# Patient Record
Sex: Female | Born: 1990
Health system: Southern US, Community
[De-identification: ages and names within clinical notes are randomized; demographics above are authoritative.]

## PROBLEM LIST (undated history)

## (undated) DIAGNOSIS — J4520 Mild intermittent asthma, uncomplicated: Secondary | ICD-10-CM

## (undated) DIAGNOSIS — T7840XA Allergy, unspecified, initial encounter: Secondary | ICD-10-CM

## (undated) HISTORY — DX: Allergy, unspecified, initial encounter: T78.40XA

## (undated) HISTORY — PX: NO PAST SURGERIES: SHX2092

## (undated) HISTORY — DX: Mild intermittent asthma, uncomplicated: J45.20

---
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
## 2015-11-27 ENCOUNTER — Other Ambulatory Visit: Payer: Self-pay | Admitting: Family Medicine

## 2015-11-27 ENCOUNTER — Ambulatory Visit (HOSPITAL_BASED_OUTPATIENT_CLINIC_OR_DEPARTMENT_OTHER)
Admission: RE | Admit: 2015-11-27 | Discharge: 2015-11-27 | Disposition: A | Discharge status: Home / Self Care | Payer: 59 | Source: Ambulatory Visit | Attending: Family Medicine | Admitting: Family Medicine

## 2015-11-27 DIAGNOSIS — J45990 Exercise induced bronchospasm: Secondary | ICD-10-CM

## 2015-11-27 DIAGNOSIS — J4520 Mild intermittent asthma, uncomplicated: Secondary | ICD-10-CM

## 2015-11-27 DIAGNOSIS — R1310 Dysphagia, unspecified: Secondary | ICD-10-CM

## 2015-11-27 DIAGNOSIS — Z789 Other specified health status: Secondary | ICD-10-CM

## 2015-11-27 MED ORDER — IOHEXOL 350 MG/ML SOLN
100.0000 mL | Freq: Once | INTRAVENOUS | Status: AC | PRN
  Administered 2015-11-27: 100 mL via INTRAVENOUS

## 2016-05-19 DIAGNOSIS — J069 Acute upper respiratory infection, unspecified: Secondary | ICD-10-CM | POA: Diagnosis not present

## 2016-05-24 DIAGNOSIS — J020 Streptococcal pharyngitis: Secondary | ICD-10-CM | POA: Diagnosis not present

## 2016-09-20 DIAGNOSIS — Z30433 Encounter for removal and reinsertion of intrauterine contraceptive device: Secondary | ICD-10-CM | POA: Diagnosis not present

## 2016-11-02 DIAGNOSIS — Z6829 Body mass index (BMI) 29.0-29.9, adult: Secondary | ICD-10-CM | POA: Diagnosis not present

## 2016-11-02 DIAGNOSIS — Z01419 Encounter for gynecological examination (general) (routine) without abnormal findings: Secondary | ICD-10-CM | POA: Diagnosis not present

## 2016-11-02 DIAGNOSIS — R87612 Low grade squamous intraepithelial lesion on cytologic smear of cervix (LGSIL): Secondary | ICD-10-CM | POA: Diagnosis not present

## 2016-11-23 DIAGNOSIS — R87612 Low grade squamous intraepithelial lesion on cytologic smear of cervix (LGSIL): Secondary | ICD-10-CM | POA: Diagnosis not present

## 2016-11-23 DIAGNOSIS — N72 Inflammatory disease of cervix uteri: Secondary | ICD-10-CM | POA: Diagnosis not present

## 2016-12-13 DIAGNOSIS — R911 Solitary pulmonary nodule: Secondary | ICD-10-CM | POA: Insufficient documentation

## 2016-12-28 MED FILL — AMOXICILLIN 875 MG TABLET: 875 | 5 days supply | Qty: 10 | Fill #0

## 2017-01-14 IMAGING — CT CT ANGIO CHEST
1 of 2 series · 20 of 32 positions shown · IV contrast (APPLIED)
Comparison: None.

CLINICAL DATA: Shortness of breath.

EXAM:
CT ANGIOGRAPHY CHEST WITH CONTRAST
TECHNIQUE: Multidetector CT imaging of the chest was performed using the
standard protocol during bolus administration of intravenous
contrast. Multiplanar CT image reconstructions and MIPs were
obtained to evaluate the vascular anatomy.
CONTRAST:  100mL OMNIPAQUE IOHEXOL 350 MG/ML SOLN

[Series 6: pe 1.0 b26f · axial · 0.63mm/px · z∈[-335,-81]mm · 20 of 284 slices shown]
[im 15/284  lung]
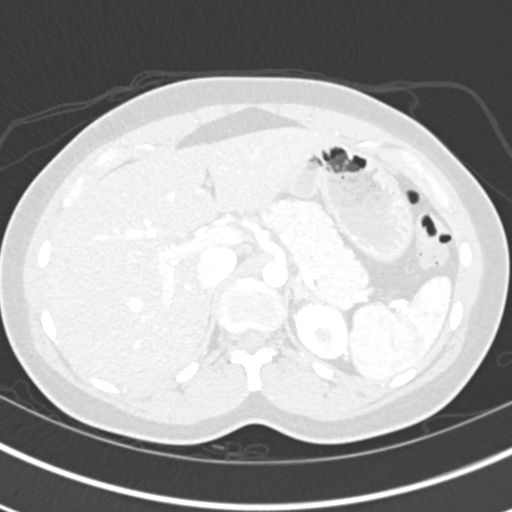
[im 29/284  mediastinal]
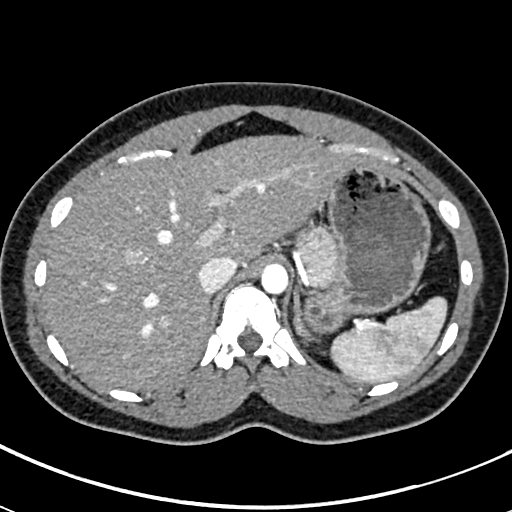
[im 43/284  lung]
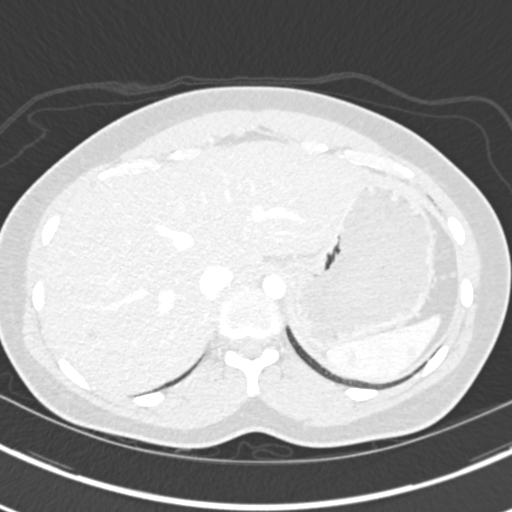
[im 57/284  mediastinal]
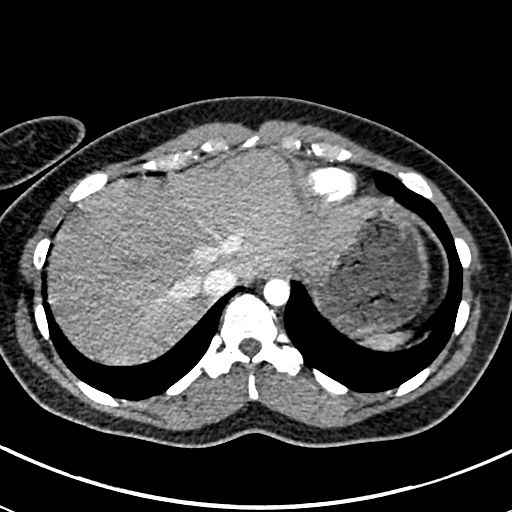
[im 71/284  lung]
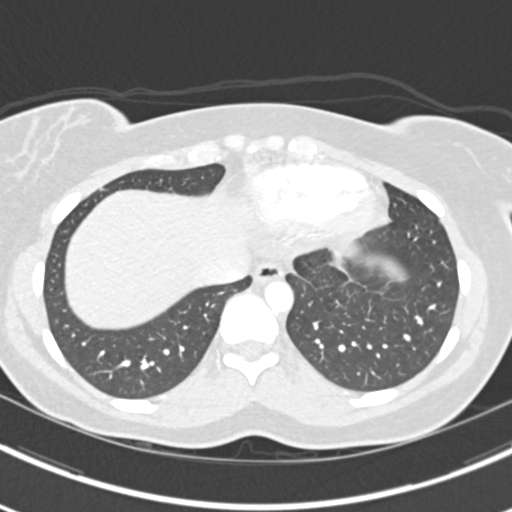
[im 95/284  mediastinal]
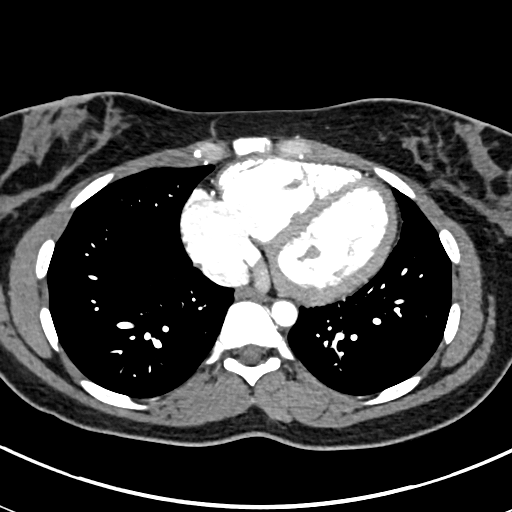
[im 100/284  lung]
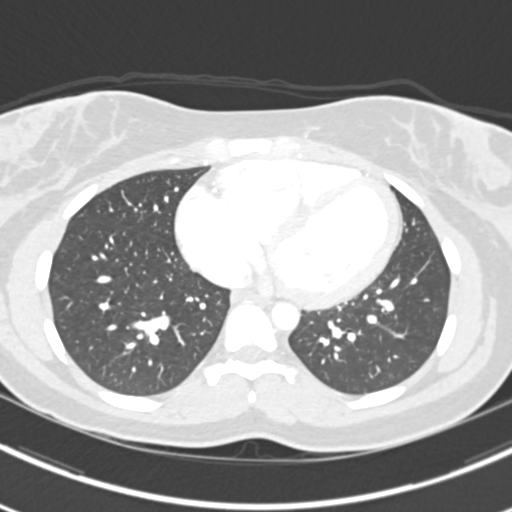
[im 114/284  mediastinal]
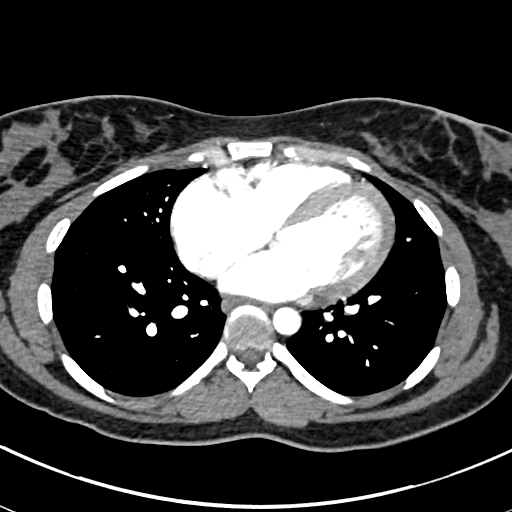
[im 128/284  lung]
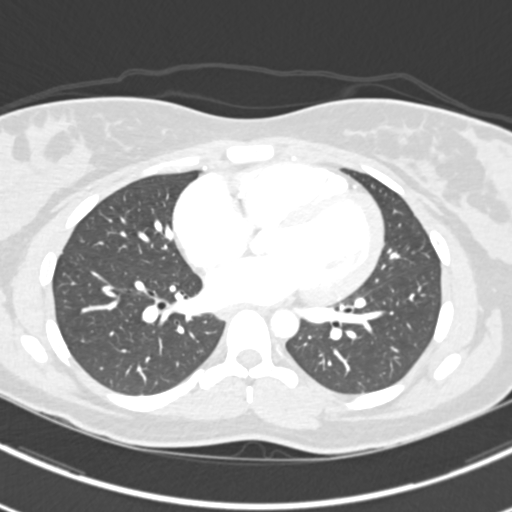
[im 133/284  mediastinal]
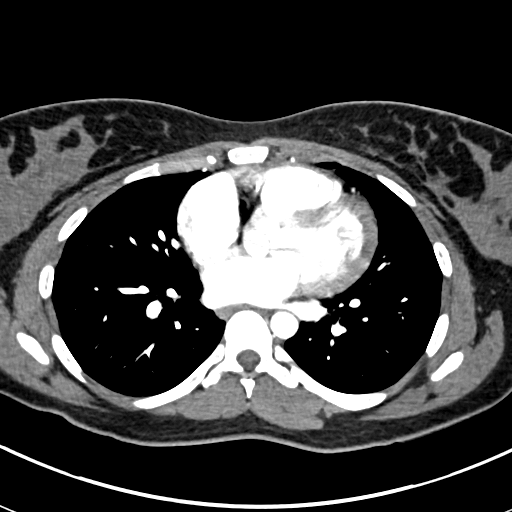
[im 142/284  lung]
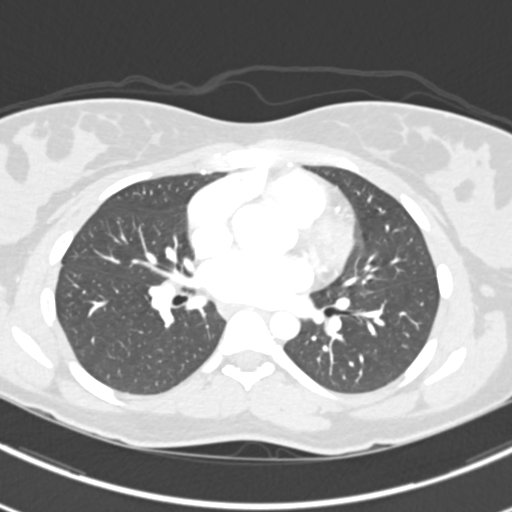
[im 156/284  mediastinal]
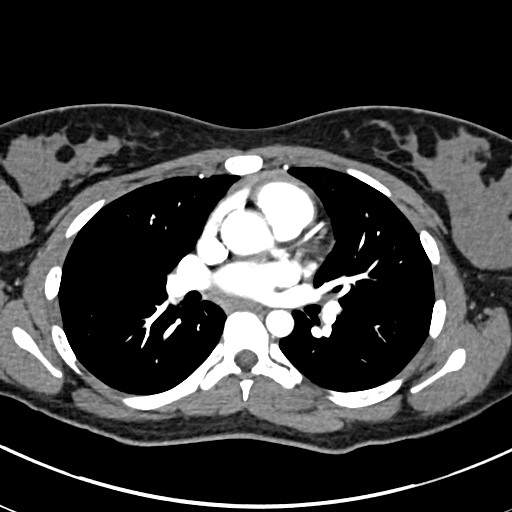
[im 170/284  lung]
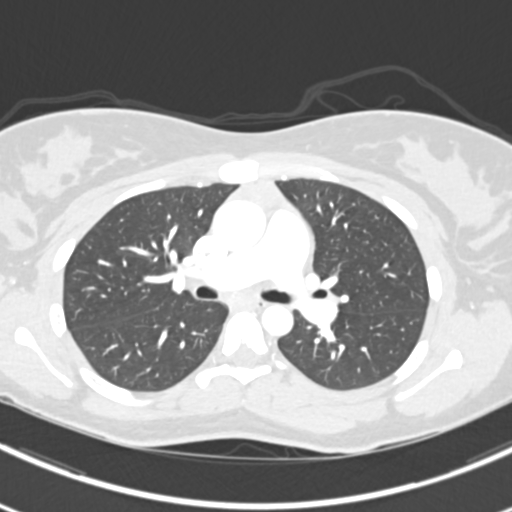
[im 184/284  mediastinal]
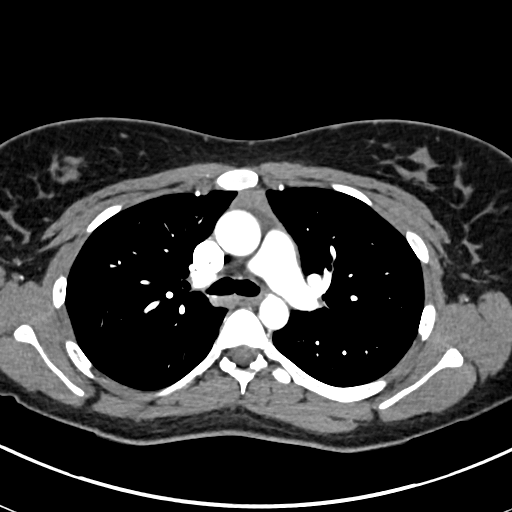
[im 189/284  lung]
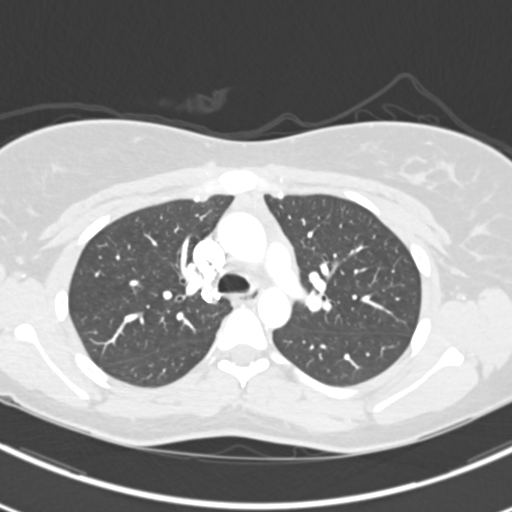
[im 213/284  mediastinal]
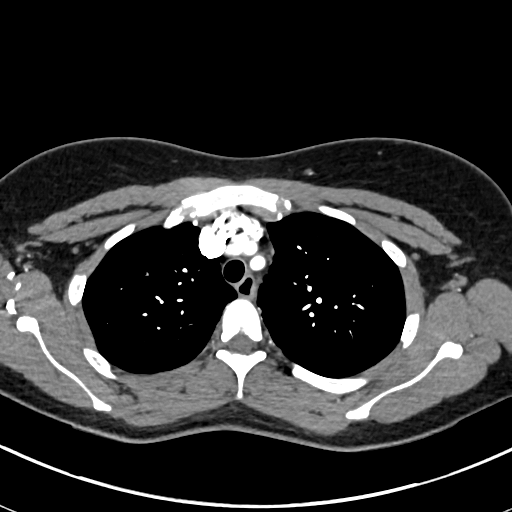
[im 227/284  lung]
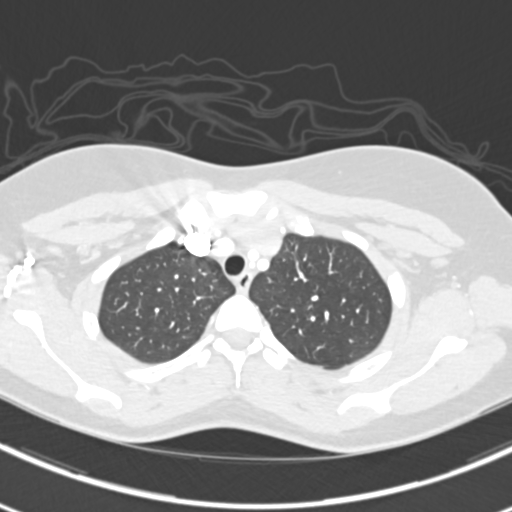
[im 241/284  mediastinal]
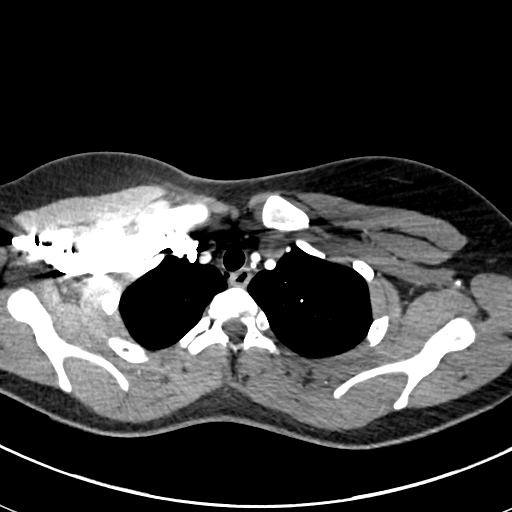
[im 255/284  lung]
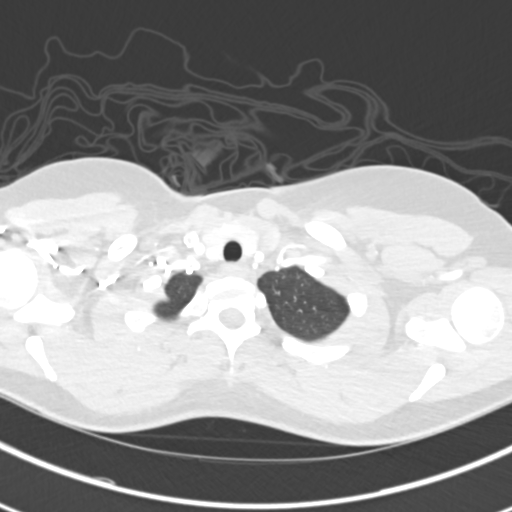
[im 269/284  mediastinal]
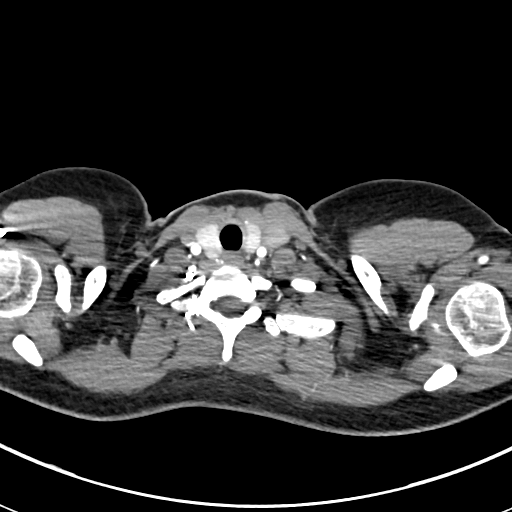

[20 of 32 positions shown; findings below may reference images not displayed]

FINDINGS: No pneumothorax or pleural effusion is noted. No acute pulmonary
disease is noted. There is no evidence of thoracic aortic dissection
or aneurysm. There is no evidence of pulmonary embolus. Visualized
portion of upper abdomen is unremarkable. No mediastinal mass or
adenopathy is noted. No significant osseous abnormality is noted.

Review of the MIP images confirms the above findings.
IMPRESSION: No evidence of pulmonary embolus. No acute cardiopulmonary
abnormality seen.

## 2017-06-13 ENCOUNTER — Encounter: Payer: Self-pay | Admitting: Family Medicine

## 2017-06-13 ENCOUNTER — Ambulatory Visit (INDEPENDENT_AMBULATORY_CARE_PROVIDER_SITE_OTHER): Payer: 59 | Admitting: Family Medicine

## 2017-06-13 VITALS — BP 119/76 | HR 66 | Temp 98.5°F | Resp 20 | Ht 64.25 in | Wt 161.5 lb

## 2017-06-13 DIAGNOSIS — Z6827 Body mass index (BMI) 27.0-27.9, adult: Secondary | ICD-10-CM

## 2017-06-13 DIAGNOSIS — M76891 Other specified enthesopathies of right lower limb, excluding foot: Secondary | ICD-10-CM | POA: Diagnosis not present

## 2017-06-13 DIAGNOSIS — G5701 Lesion of sciatic nerve, right lower limb: Secondary | ICD-10-CM

## 2017-06-13 DIAGNOSIS — Z114 Encounter for screening for human immunodeficiency virus [HIV]: Secondary | ICD-10-CM

## 2017-06-13 DIAGNOSIS — Z7689 Persons encountering health services in other specified circumstances: Secondary | ICD-10-CM | POA: Diagnosis not present

## 2017-06-13 DIAGNOSIS — M7631 Iliotibial band syndrome, right leg: Secondary | ICD-10-CM | POA: Diagnosis not present

## 2017-06-13 DIAGNOSIS — Z975 Presence of (intrauterine) contraceptive device: Secondary | ICD-10-CM

## 2017-06-13 NOTE — Progress Notes (Signed)
Patient ID: Laurie Bright, female  DOB: 1991/03/30, 26 y.o.   MRN: [id number] Patient Care Team    Relationship Specialty Notifications Start End  Kiylee Leatherwood, DO PCP - General Family Medicine  06/13/17   Christene Lye, MD Referring Physician Otolaryngology  06/13/17   Maryan Rued, NP Nurse Practitioner Gynecology  06/14/17     Chief Complaint  Patient presents with  . Establish Care  . Annual Exam    Subjective:  Laurie Bright is a 26 y.o.  female present for new patient establishment. All past medical history, surgical history, allergies, family history, immunizations, medications and social history were obtained and updated in the electronic medical record today. All recent labs, ED visits and hospitalizations within the last year were reviewed.  IT band/piriformis discomfort: Patient presents for new patient establishment with complaints of right hip flexor pain that has been recurring since March 2018. She is a Pharmacist, hospital, and a runner. She reports she started warming up and cooling down religiously. She is very conscientious of her running shoe mileage. She has been performing stretches, and states it feels better and in external rotation of her hip. She typically runs on a low impact treadmill. She is getting more concerned since this seems to be lingering despite icing and stretching. She also endorses tenderness right lateral hip and right piriformis.  Health maintenance:  Colonoscopy: No family history, screen at 44 Mammogram: No family history screening at 35 Cervical cancer screening: last pap: 10/2016, results: Normal per patient, completed by: [facility] Immunizations: tdap 2015 UTD, Influenza 2016 (encouraged yearly) Infectious disease screening: HIV completed today per patient request.   Depression screen [facility] 2/9 06/13/2017  Decreased Interest 0  Down, Depressed, Hopeless 0  PHQ - 2 Score 0   No flowsheet data found.    Current Exercise  Habits: Home exercise routine;Structured exercise class, Type of exercise: strength training/weights;Other - see comments, Time (Minutes): 60, Frequency (Times/Week): 6, Weekly Exercise (Minutes/Week): 360, Intensity: Intense Exercise limited by: None identified Fall Risk  06/13/2017  Falls in the past year? No     Immunization History  Administered Date(s) Administered  . Influenza, Seasonal, Injecte, Preservative Fre 12/04/2014, 11/27/2015  . Meningococcal Conjugate 07/17/2014    No exam data present  Past Medical History:  Diagnosis Date  . Allergy   . Asthma, intermittent    Not on File Past Surgical History:  Procedure Laterality Date  . NO PAST SURGERIES     Family History  Problem Relation Age of Onset  . Diabetes Maternal Grandmother   . Diabetes Paternal Grandmother    Social History   Social History  . Marital status: Single    Spouse name: N/A  . Number of children: 0  . Years of education: PhD   Occupational History  . Not on file.   Social History Main Topics  . Smoking status: Never Smoker  . Smokeless tobacco: Never Used  . Alcohol use No  . Drug use: No  . Sexual activity: Yes    Partners: Male    Birth control/ protection: IUD   Other Topics Concern  . 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   Drinks caffeinated beverages, uses herbal remedies. Takes a daily vitamin.   Wears her seatbelt. Wears a bicycle helmet.   Exercise is very routinely both with her current employment and she is also a runner.   Smoke detector in the  home. Firearms locked in the home.   Feels safe in her relationships.      Allergies as of 06/13/2017   Not on File     Medication List       Accurate as of 06/13/17 11:59 PM. Always use your most recent med list.          KYLEENA 19.5 MG Iud Generic drug:  Levonorgestrel by Intrauterine route.   meloxicam 15 MG tablet Commonly known as:  MOBIC Take 1 tablet (15 mg total)  by mouth daily.       All past medical history, surgical history, allergies, family history, immunizations andmedications were updated in the EMR today and reviewed under the history and medication portions of their EMR.    Recent Results (from the past 2160 hour(s))  HIV antibody     Status: None   Collection Time: 06/13/17  2:28 PM  Result Value Ref Range   HIV 1&2 Ab, 4th Generation NONREACTIVE NONREACTIVE    Comment:   HIV-1 antigen and HIV-1/HIV-2 antibodies were not detected.  There is no laboratory evidence of HIV infection.   HIV-1/2 Antibody Diff        Not indicated. HIV-1 RNA, Qual TMA          Not indicated.     PLEASE NOTE: This information has been disclosed to you from records whose confidentiality may be protected by state law. If your state requires such protection, then the state law prohibits you from making any further disclosure of the information without the specific written consent of the person to whom it pertains, or as otherwise permitted by law. A general authorization for the release of medical or other information is NOT sufficient for this purpose.   The performance of this assay has not been clinically validated in patients less than 47 years old.   For additional information please refer to http://education.questdiagnostics.com/faq/FAQ106.  (This link is being provided for informational/educational purposes only.)       Ct Angio Chest Pe W/cm &/or Wo Cm  Result Date: 11/27/2015 CLINICAL DATA:  Shortness of breath. EXAM: CT ANGIOGRAPHY CHEST WITH CONTRAST TECHNIQUE: Multidetector CT imaging of the chest was performed using the standard protocol during bolus administration of intravenous contrast. Multiplanar CT image reconstructions and MIPs were obtained to evaluate the vascular anatomy. CONTRAST:  OMNIPAQUE IOHEXOL 350 MG/ML SOLN COMPARISON:  None. FINDINGS: No pneumothorax or pleural effusion is noted. No acute pulmonary disease is noted.  There is no evidence of thoracic aortic dissection or aneurysm. There is no evidence of pulmonary embolus. Visualized portion of upper abdomen is unremarkable. No mediastinal mass or adenopathy is noted. No significant osseous abnormality is noted. Review of the MIP images confirms the above findings. IMPRESSION: No evidence of pulmonary embolus. No acute cardiopulmonary abnormality seen. Electronically Signed   By: Lupita Raider, M.D.   On: 11/27/2015 19:17     ROS: 14 pt review of systems performed and negative (unless mentioned in an HPI)  Objective: BP 119/76 (BP Location: Right Arm, Patient Position: Sitting, Cuff Size: Normal)   Pulse 66   Temp 98.5 F (36.9 C)   Resp 20   Ht 5' 4.25" (1.632 m)   Wt 161 lb 8 oz (73.3 kg)   SpO2 98%   BMI 27.51 kg/m  Gen: Afebrile. No acute distress. Nontoxic in appearance, well-developed, well-nourished,  Very pleasant, Caucasian female. Physically fit. HENT: AT. [place].  MMM Eyes:Pupils Equal Round Reactive to light, Extraocular movements intact,  Conjunctiva without redness, discharge or icterus. Neck/lymp/endocrine: Supple, no thyromegaly CV: RRR no murmur, no edema, +2/4 P posterior tibialis pulses.  Chest: CTAB, no wheeze, rhonchi or crackles.  Abd: Soft.NTND. BS present  Skin: Warm and well-perfused. Skin intact. Neuro/Msk: Normal gait. PERLA. EOMi. Alert. Oriented x3.  No erythema, no soft tissue swelling. Mild tenderness to palpation lateral right thigh/IT band.Tenderness to palpation right piriformis. No tenderness to palpation over hip flexor. Negative Faber and straight leg raise bilaterally.  Muscle strength 5/5 lower extremity without discomfort in resistance. Psych: Normal affect, dress and demeanor. Normal speech. Normal thought content and judgment.   Assessment/plan: Burtis Junesatalia Earhart is a 26 y.o. female present for establishment.  Iliotibial band syndrome of right side Piriformis syndrome, right Hip flexor tendinitis, right -  Discussed resting, stretching, NSAIDs. OMT. Resting is all by easy for her considering her job entails her being physically active. -  Meloxicam daily with a meal for 2 weeks, then can use at times of injury. - Ambulatory referral to Sports Medicine--> OMT referral.  - f/u PRN  Encounter for screening for HIV - pt desired testing today - HIV antibody  IUD (intrauterine device) in place - Placed November 2017 by Charlotte SanesGYN, Kyleena (5 year)  BMI 27.0-27.9,adult - physically fit.    Return if symptoms worsen or fail to improve.   Note is dictated utilizing voice recognition software. Although note has been proof read prior to signing, occasional typographical errors still can be missed. If any questions arise, please do not hesitate to call for verification.  Electronically signed by: Felix Pacinienee Leba Tibbitts, DO [facility]

## 2017-06-13 NOTE — Patient Instructions (Signed)
It was a pleasure to meet you today. I will refer you to Dr. Katrinka BlazingSmith for OMT (osteopathic manipulation) .   I have called in mobic for you to use daily for next 2 weeks (with meal). You can then use during times of injury.   We will call you with lab results once available.     Piriformis Syndrome Rehab Ask your health care provider which exercises are safe for you. Do exercises exactly as told by your health care provider and adjust them as directed. It is normal to feel mild stretching, pulling, tightness, or discomfort as you do these exercises, but you should stop right away if you feel sudden pain or your pain gets worse.Do not begin these exercises until told by your health care provider. Stretching and range of motion exercises These exercises warm up your muscles and joints and improve the movement and flexibility of your hip and pelvis. These exercises also help to relieve pain, numbness, and tingling. Exercise A: Hip rotators  1. Lie on your back on a firm surface. 2. Pull your left / right knee toward your same shoulder with your left / right hand until your knee is pointing toward the ceiling. Hold your left / right ankle with your other hand. 3. Keeping your knee steady, gently pull your left / right ankle toward your other shoulder until you feel a stretch in your buttocks. 4. Hold this position for __________ seconds. Repeat __________ times. Complete this stretch __________ times a day. Exercise B: Hip extensors 1. Lie on your back on a firm surface. Both of your legs should be straight. 2. Pull your left / right knee to your chest. Hold your leg in this position by holding onto the back of your thigh or the front of your knee. 3. Hold this position for __________ seconds. 4. Slowly return to the starting position. Repeat __________ times. Complete this stretch __________ times a day. Strengthening exercises These exercises build strength and endurance in your hip and thigh  muscles. Endurance is the ability to use your muscles for a long time, even after they get tired. Exercise C: Straight leg raises ( hip abductors) 1. Lie on your side with your left / right leg in the top position. Lie so your head, shoulder, knee, and hip line up. Bend your bottom knee to help you balance. 2. Lift your top leg up 4-6 inches (10-15 cm), keeping your toes pointed straight ahead. 3. Hold this position for __________ seconds. 4. Slowly lower your leg to the starting position. Let your muscles relax completely. Repeat __________ times. Complete this exercise__________ times a day. Exercise D: Hip abductors and rotators, quadruped  1. Get on your hands and knees on a firm, lightly padded surface. Your hands should be directly below your shoulders, and your knees should be directly below your hips. 2. Lift your left / right knee out to the side. Keep your knee bent. Do not twist your body. 3. Hold this position for __________ seconds. 4. Slowly lower your leg. Repeat __________ times. Complete this exercise__________ times a day. Exercise E: Straight leg raises ( hip extensors) 1. Lie on your abdomen on a bed or a firm surface with a pillow under your hips. 2. Squeeze your buttock muscles and lift your left / right thigh off the bed. Do not let your back arch. 3. Hold this position for __________ seconds. 4. Slowly return to the starting position. Let your muscles relax completely before doing another repetition.  Repeat __________ times. Complete this exercise__________ times a day. This information is not intended to replace advice given to you by your health care provider. Make sure you discuss any questions you have with your health care provider. Document Released: 11/29/2005 Document Revised: 08/03/2016 Document Reviewed: 11/11/2015 Elsevier Interactive Patient Education  [employer].

## 2017-06-14 ENCOUNTER — Telehealth: Payer: Self-pay | Admitting: Family Medicine

## 2017-06-14 ENCOUNTER — Encounter: Payer: Self-pay | Admitting: Family Medicine

## 2017-06-14 DIAGNOSIS — Z975 Presence of (intrauterine) contraceptive device: Secondary | ICD-10-CM | POA: Insufficient documentation

## 2017-06-14 DIAGNOSIS — M7631 Iliotibial band syndrome, right leg: Secondary | ICD-10-CM | POA: Insufficient documentation

## 2017-06-14 DIAGNOSIS — Z6827 Body mass index (BMI) 27.0-27.9, adult: Secondary | ICD-10-CM | POA: Insufficient documentation

## 2017-06-14 DIAGNOSIS — M76891 Other specified enthesopathies of right lower limb, excluding foot: Secondary | ICD-10-CM | POA: Insufficient documentation

## 2017-06-14 DIAGNOSIS — G5701 Lesion of sciatic nerve, right lower limb: Secondary | ICD-10-CM | POA: Insufficient documentation

## 2017-06-14 LAB — HIV ANTIBODY (ROUTINE TESTING W REFLEX): HIV 1&2 Ab, 4th Generation: NONREACTIVE

## 2017-06-14 MED ORDER — MELOXICAM 15 MG PO TABS: 15.0000 mg | ORAL_TABLET | Freq: Every day | ORAL | 2 refills | Status: AC

## 2017-06-14 MED FILL — MELOXICAM 15 MG TABLET: 15 | 30 days supply | Qty: 30 | Fill #0 | Status: TO

## 2017-06-14 NOTE — Telephone Encounter (Signed)
Please call pt:  HIV screen negative. Referral placed for Dr. Katrinka BlazingSmith for OMT (osteopathic manipulation)

## 2017-06-14 NOTE — Telephone Encounter (Signed)
Left message on patient voice mail with lab results and information per DPR.

## 2017-07-06 ENCOUNTER — Ambulatory Visit (INDEPENDENT_AMBULATORY_CARE_PROVIDER_SITE_OTHER): Payer: 59 | Admitting: Family Medicine

## 2017-07-06 ENCOUNTER — Ambulatory Visit: Payer: 59 | Admitting: Family Medicine

## 2017-07-06 ENCOUNTER — Encounter: Payer: Self-pay | Admitting: Family Medicine

## 2017-07-06 ENCOUNTER — Ambulatory Visit (INDEPENDENT_AMBULATORY_CARE_PROVIDER_SITE_OTHER)
Admission: RE | Admit: 2017-07-06 | Discharge: 2017-07-06 | Disposition: A | Discharge status: Home / Self Care | Payer: 59 | Source: Ambulatory Visit | Attending: Family Medicine | Admitting: Family Medicine

## 2017-07-06 ENCOUNTER — Ambulatory Visit: Payer: Self-pay

## 2017-07-06 VITALS — BP 98/70 | HR 68 | Ht 64.5 in | Wt 162.0 lb

## 2017-07-06 DIAGNOSIS — S92252A Displaced fracture of navicular [scaphoid] of left foot, initial encounter for closed fracture: Secondary | ICD-10-CM | POA: Insufficient documentation

## 2017-07-06 DIAGNOSIS — S92255A Nondisplaced fracture of navicular [scaphoid] of left foot, initial encounter for closed fracture: Secondary | ICD-10-CM | POA: Diagnosis not present

## 2017-07-06 DIAGNOSIS — M79672 Pain in left foot: Secondary | ICD-10-CM

## 2017-07-06 MED ORDER — VITAMIN D (ERGOCALCIFEROL) 1.25 MG (50000 UNIT) PO CAPS: 50000.0000 [IU] | ORAL_CAPSULE | ORAL | 0 refills | Status: DC

## 2017-07-06 MED FILL — VIT D2 1.25 MG (50,000 UNIT: 1.25 MG | 84 days supply | Qty: 12 | Fill #0

## 2017-07-06 NOTE — Assessment & Plan Note (Signed)
Patient does likely have a stress fracture. X-rays ordered today to rule out any avascular necrosis that can be contribute in. Discussed icing regimen and home exercises. Once weekly vitamin D, rigid shoes suggested. Declined boot secondary to patient's job. Patient will come back and see me again in 3-4 weeks.

## 2017-07-06 NOTE — Patient Instructions (Addendum)
Good to see you  Xray downstairs Ice is your friend. Ice 20 minutes 2 times daily. Usually after activity and before bed. Once weekly vitamin D for 12 weeks.  Never be barefoot.  Look into brooks or altra as running shoes  No running for 3 weeks Exercises 3 times a week.  See me again in 3-4 weeks.

## 2017-07-06 NOTE — Progress Notes (Signed)
Laurie Bright D.O. [facility] [address] [place], [place] [address] Phone: [phone] Subjective:    I'm seeing this patient by the request  of:  Kuneff, Renee A, DO   CC: Left foot pain  BJY:[id number]:Subjective  Laurie Bright is a 26 y.o. female coming in with complaint of left foot pain. Been dx for plantar fasciitis previously. Patient states that that seems to be improving somewhat. Unfortunately now having more pain on the bottom of the foot. Patient is an avid runner and wants to continue to. Patient states that it seems to better with rest. This invested in multiple shoes. Patient is a Systems analystpersonal trainer. Patient states the shoes that she has changed has been some very mild improvement. Patient though is unable to work out like she would like.     Past Medical History:  Diagnosis Date  . Allergy   . Asthma, intermittent    Past Surgical History:  Procedure Laterality Date  . NO PAST SURGERIES     Social History   Social History  . Marital status: Single    Spouse name: N/A  . Number of children: 0  . Years of education: PhD   Social History Main Topics  . Smoking status: Never Smoker  . Smokeless tobacco: Never Used  . Alcohol use No  . Drug use: No  . Sexual activity: Yes    Partners: Male    Birth control/ protection: IUD   Other Topics Concern  . 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   Drinks caffeinated beverages, uses herbal remedies. Takes a daily vitamin.   Wears her seatbelt. Wears a bicycle helmet.   Exercise is very routinely both with her current employment and she is also a runner.   Smoke detector in the home. Firearms locked in the home.   Feels safe in her relationships.      Not on File Family History  Problem Relation Age of Onset  . Diabetes Maternal Grandmother   . Diabetes Paternal Grandmother      Past medical history, social, surgical and family history all reviewed in electronic medical  record.  No pertanent information unless stated regarding to the chief complaint.   Review of Systems:Review of systems updated and as accurate as of 07/06/17  No headache, visual changes, nausea, vomiting, diarrhea, constipation, dizziness, abdominal pain, skin rash, fevers, chills, night sweats, weight loss, swollen lymph nodes, body aches, joint swelling, muscle aches, chest pain, shortness of breath, mood changes.   Objective  Blood pressure 98/70, height 5' 4.5" (1.638 m), weight 162 lb (73.5 kg). Systems examined below as of 07/06/17   General: No apparent distress alert and oriented x3 mood and affect normal, dressed appropriately.  HEENT: Pupils equal, extraocular movements intact  Respiratory: Patient's speak in full sentences and does not appear short of breath  Cardiovascular: No lower extremity edema, non tender, no erythema  Skin: Warm dry intact with no signs of infection or rash on extremities or on axial skeleton.  Abdomen: Soft nontender  Neuro: Cranial nerves II through XII are intact, neurovascularly intact in all extremities with 2+ DTRs and 2+ pulses.  Lymph: No lymphadenopathy of posterior or anterior cervical chain or axillae bilaterally.  Gait normal with good balance and coordination.  MSK:  Non tender with full range of motion and good stability and symmetric strength and tone of shoulders, elbows, wrist, hip, knee and ankles bilaterally.  Left foot exam shows  the patient does have severe pes planus. Patient has insufficiency of the posterior tibialis tendon. Tenderness over the navicular bone.  Limited muscular skeletal ultrasound was performed and interpreted by Judi SaaZachary M Izacc Demeyer  Limited ultrasound shows the patient does have what appears to be a cortical defect of the navicular bone and area of most tenderness. Increasing Doppler flow in the area. No true displacement. Impression: Navicular stress fracture.   Impression and Recommendations:     This case  required medical decision making of moderate complexity.      Note: This dictation was prepared with Dragon dictation along with smaller phrase technology. Any transcriptional errors that result from this process are unintentional.

## 2017-07-27 ENCOUNTER — Encounter: Payer: Self-pay | Admitting: Family Medicine

## 2017-07-27 ENCOUNTER — Ambulatory Visit (INDEPENDENT_AMBULATORY_CARE_PROVIDER_SITE_OTHER): Payer: 59 | Admitting: Family Medicine

## 2017-07-27 ENCOUNTER — Ambulatory Visit: Payer: Self-pay

## 2017-07-27 VITALS — BP 110/82 | HR 72 | Ht 64.5 in | Wt 163.0 lb

## 2017-07-27 DIAGNOSIS — S92255D Nondisplaced fracture of navicular [scaphoid] of left foot, subsequent encounter for fracture with routine healing: Secondary | ICD-10-CM

## 2017-07-27 DIAGNOSIS — M79672 Pain in left foot: Secondary | ICD-10-CM

## 2017-07-27 NOTE — Patient Instructions (Signed)
Good to see you  You are doing great  Continue the vitamins I am ok in 2 weeks to run 1 time a week  In 4 weeks 2 times a week  Ice maybe not as intense now See me again in 6 weeks.

## 2017-07-27 NOTE — Assessment & Plan Note (Signed)
Good to see you  Once weekly vitamin D for 12 weeks.  Continue conservative therapy will be another 4-6 weeks of healing.

## 2017-07-27 NOTE — Progress Notes (Signed)
Laurie Bright D.O. [facility] [address] [place], [place] [address] Phone: [phone] Subjective:    I'm seeing this patient by the request  of:  Kuneff, Renee A, DO   CC: Left foot pain Follow-up  VHQ:[id number]:Subjective  Laurie Bright is a 26 y.o. female coming in with complaint of left foot pain. Patient found to have more of a navicular stress fracture. Once to decrease. All high interval and high intensity workouts. Patient states that she is lying down that someone. Patient is also Systems analystpersonal trainer. States that the pain is 50-60% better. Less swelling. No side effects to the vitamin D supplementation. Has been icing regularly. Doing the exercises intermittent.    Past Medical History:  Diagnosis Date  . Allergy   . Asthma, intermittent    Past Surgical History:  Procedure Laterality Date  . NO PAST SURGERIES     Social History   Social History  . Marital status: Single    Spouse name: N/A  . Number of children: 0  . Years of education: PhD   Social History Main Topics  . Smoking status: Never Smoker  . Smokeless tobacco: Never Used  . Alcohol use No  . Drug use: No  . Sexual activity: Yes    Partners: Male    Birth control/ protection: IUD   Other Topics Concern  . 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   Drinks caffeinated beverages, uses herbal remedies. Takes a daily vitamin.   Wears her seatbelt. Wears a bicycle helmet.   Exercise is very routinely both with her current employment and she is also a runner.   Smoke detector in the home. Firearms locked in the home.   Feels safe in her relationships.      Not on File Family History  Problem Relation Age of Onset  . Diabetes Maternal Grandmother   . Diabetes Paternal Grandmother      Past medical history, social, surgical and family history all reviewed in electronic medical record.  No pertanent information unless stated regarding to the chief complaint.    Review of Systems: No headache, visual changes, nausea, vomiting, diarrhea, constipation, dizziness, abdominal pain, skin rash, fevers, chills, night sweats, weight loss, swollen lymph nodes, body aches, joint swelling, chest pain, shortness of breath, mood changes.  Positive muscle aches  Objective  Blood pressure 110/82, pulse 72, height 5' 4.5" (1.638 m), weight 163 lb (73.9 kg), last menstrual period 06/29/2017.   Systems examined below as of 07/27/17 General: NAD A&O x3 mood, affect normal  HEENT: Pupils equal, extraocular movements intact no nystagmus Respiratory: not short of breath at rest or with speaking Cardiovascular: No lower extremity edema, non tender Skin: Warm dry intact with no signs of infection or rash on extremities or on axial skeleton. Abdomen: Soft nontender, no masses Neuro: Cranial nerves  intact, neurovascularly intact in all extremities with 2+ DTRs and 2+ pulses. Lymph: No lymphadenopathy appreciated today  Gait normal with good balance and coordination.  MSK: Non tender with full range of motion and good stability and symmetric strength and tone of shoulders, elbows, wrist,  knee hips bilaterally.   Left Ankle shows no significant tenderness at this time. Full range of motion. Full strength. Negative percussion  Limited musculoskeletal ultrasound was performed and interpreted by Judi SaaZachary M Kathrina Crosley  Limited ultrasound of patient's left ankle shows the patient does have good callus formation over the navicular bone itself. Impression: Good callus formation  over the navicular stress fracture that was seen previously.       Impression and Recommendations:     This case required medical decision making of moderate complexity.      Note: This dictation was prepared with Dragon dictation along with smaller phrase technology. Any transcriptional errors that result from this process are unintentional.

## 2017-09-05 ENCOUNTER — Encounter: Payer: Self-pay | Admitting: Family Medicine

## 2017-09-05 ENCOUNTER — Ambulatory Visit (INDEPENDENT_AMBULATORY_CARE_PROVIDER_SITE_OTHER): Payer: 59 | Admitting: Family Medicine

## 2017-09-05 DIAGNOSIS — M76821 Posterior tibial tendinitis, right leg: Secondary | ICD-10-CM | POA: Insufficient documentation

## 2017-09-05 DIAGNOSIS — S92255D Nondisplaced fracture of navicular [scaphoid] of left foot, subsequent encounter for fracture with routine healing: Secondary | ICD-10-CM | POA: Diagnosis not present

## 2017-09-05 DIAGNOSIS — M76822 Posterior tibial tendinitis, left leg: Secondary | ICD-10-CM | POA: Diagnosis not present

## 2017-09-05 MED ORDER — VITAMIN D (ERGOCALCIFEROL) 1.25 MG (50000 UNIT) PO CAPS: 50000.0000 [IU] | ORAL_CAPSULE | ORAL | 0 refills | Status: DC

## 2017-09-05 MED ORDER — DICLOFENAC SODIUM 2 % TD SOLN: 2.0000 "application " | Freq: Two times a day (BID) | TRANSDERMAL | 3 refills | Status: AC

## 2017-09-05 NOTE — Progress Notes (Signed)
[name] [address] [place], [place] [address] Phone: [phone] Subjective:    I'm seeing this patient by the request  of:  Kuneff, Renee A, DO   CC: Left foot pain Follow-up  [id number]  Laurie Bright is a 26 y.o. female coming in with complaint of left foot pain. Patient found to have more of a navicular stress fracture. Once to decrease. All high interval and high intensity workouts. Patient states that she is lying down that someone. Patient is also Systems analyst. Patient is now 85-90% better. Some tenderness in the ankle but nothing severe. Feels like she is making progress.    Past Medical History:  Diagnosis Date  . Allergy   . Asthma, intermittent    Past Surgical History:  Procedure Laterality Date  . NO PAST SURGERIES     Social History   Social History  . Marital status: Single    Spouse name: N/A  . Number of children: 0  . Years of education: PhD   Social History Main Topics  . Smoking status: Never Smoker  . Smokeless tobacco: Never Used  . Alcohol use No  . Drug use: No  . Sexual activity: Yes    Partners: Male    Birth control/ protection: IUD   Other Topics Concern  . 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   Drinks caffeinated beverages, uses herbal remedies. Takes a daily vitamin.   Wears her seatbelt. Wears a bicycle helmet.   Exercise is very routinely both with her current employment and she is also a runner.   Smoke detector in the home. Firearms locked in the home.   Feels safe in her relationships.      Not on File Family History  Problem Relation Age of Onset  . Diabetes Maternal Grandmother   . Diabetes Paternal Grandmother      Past medical history, social, surgical and family history all reviewed in electronic medical record.  No pertanent information unless stated regarding to the chief complaint.   Review of Systems: No headache, visual changes,  nausea, vomiting, diarrhea, constipation, dizziness, abdominal pain, skin rash, fevers, chills, night sweats, weight loss, swollen lymph nodes, body aches, joint swelling,  chest pain, shortness of breath, mood changes.  Positive muscle aches  Objective  Blood pressure 110/70, pulse 78, height  (1.651 m), weight 156 lb (70.8 kg), SpO2 98 %.   Systems examined below as of 09/05/17 General: NAD A&O x3 mood, affect normal  HEENT: Pupils equal, extraocular movements intact no nystagmus Respiratory: not short of breath at rest or with speaking Cardiovascular: No lower extremity edema, non tender Skin: Warm dry intact with no signs of infection or rash on extremities or on axial skeleton. Abdomen: Soft nontender, no masses Neuro: Cranial nerves  intact, neurovascularly intact in all extremities with 2+ DTRs and 2+ pulses. Lymph: No lymphadenopathy appreciated today  Gait normal with good balance and coordination.  MSK: Non tender with full range of motion and good stability and symmetric strength and tone of shoulders, elbows, wrist,  knee hips bilaterally.   Left ankle shows the patient does have minimal discomfort at this time. Full range of motion. Mild pain over the posterior tibialis with very mild supination of the foot.      Impression and Recommendations:     This case required medical decision making of moderate complexity.      Note: This dictation was  prepared with Dragon dictation along with smaller phrase technology. Any transcriptional errors that result from this process are unintentional.

## 2017-09-05 NOTE — Assessment & Plan Note (Signed)
Using an anatomical model, I reviewed with the patient the structures involved and how they related to their diagnosis. The patient indicated that they understood our discussion and the anatomy involved.   Ice massage 2-3 times a day Rehab as described in p/i, toe raises, pidgeon toed and walking pidgeon toed  The patient would benefit from an ASO ankle brace

## 2017-09-05 NOTE — Assessment & Plan Note (Signed)
I believe the patient is healed at this time and does have what appears to be a posterior tibialis reaction. I discussed with patient about home exercises and given this today. We discussed icing regimen. Which activities to avoid. Patient will increase activity as tolerated. Follow-up with me again in 4 weeks

## 2017-09-05 NOTE — Patient Instructions (Addendum)
Good to see you  Laurie Bright is your friend.  We will get orthotic.  Continue vitamin D See me again in 6 weekish

## 2017-09-12 ENCOUNTER — Ambulatory Visit: Payer: 59 | Admitting: Family Medicine

## 2017-11-01 ENCOUNTER — Telehealth: Payer: Self-pay | Admitting: Family Medicine

## 2017-11-01 MED FILL — VIT D2 1.25 MG (50,000 UNIT: 1.25 MG | 84 days supply | Qty: 12 | Fill #0 | Status: TO

## 2017-11-01 NOTE — Telephone Encounter (Signed)
Can you please follow up with patient. She is requesting to be fitted for her brace while in town for this appointment tomorrow.

## 2017-11-02 ENCOUNTER — Encounter: Payer: Self-pay | Admitting: Family Medicine

## 2017-11-02 ENCOUNTER — Ambulatory Visit (INDEPENDENT_AMBULATORY_CARE_PROVIDER_SITE_OTHER): Payer: 59 | Admitting: Family Medicine

## 2017-11-02 DIAGNOSIS — M76822 Posterior tibial tendinitis, left leg: Secondary | ICD-10-CM | POA: Diagnosis not present

## 2017-11-02 NOTE — Progress Notes (Signed)
  Procedure Note   Patient was fitted for a : standard, cushioned, semi-rigid orthotic. The orthotic was heated and afterward the patient patient seated position and molded The patient was positioned in subtalar neutral position and 10 degrees of ankle dorsiflexion in a weight bearing stance. After completion of molding, patient did have orthotic management The blank was ground to a stable position for weight bearing. Size:9 Base: Carbon fiber Additional Posting and Padding: Left medial 200/40 Transverse arch 200/40 Right medial 200/40 The patient ambulated these, and they were very comfortable.

## 2017-11-02 NOTE — Assessment & Plan Note (Signed)
Placed in custom orthotics. We discussed increasing activity over the course of the next couple weeks.  We discussed which activities of doing which wants to avoid.

## 2018-02-03 ENCOUNTER — Other Ambulatory Visit: Payer: Self-pay | Admitting: Family Medicine

## 2018-02-03 MED FILL — SHIPPING COST: 1 days supply | Qty: 1 | Fill #0

## 2018-02-03 MED FILL — MELOXICAM 15 MG TABLET: 15 | 30 days supply | Qty: 30 | Fill #0

## 2018-02-06 MED FILL — VIT D2 1.25 MG (50,000 UNIT: 1.25 MG | 84 days supply | Qty: 12 | Fill #0

## 2018-08-24 IMAGING — DX DG FOOT COMPLETE 3+V*L*
3 series · 3 of 3 positions shown · non-contrast
Comparison: None.

CLINICAL DATA: Left foot pain for 1 month, no known injury, initial
encounter

EXAM:
LEFT FOOT - COMPLETE 3+ VIEW

[foot ap]
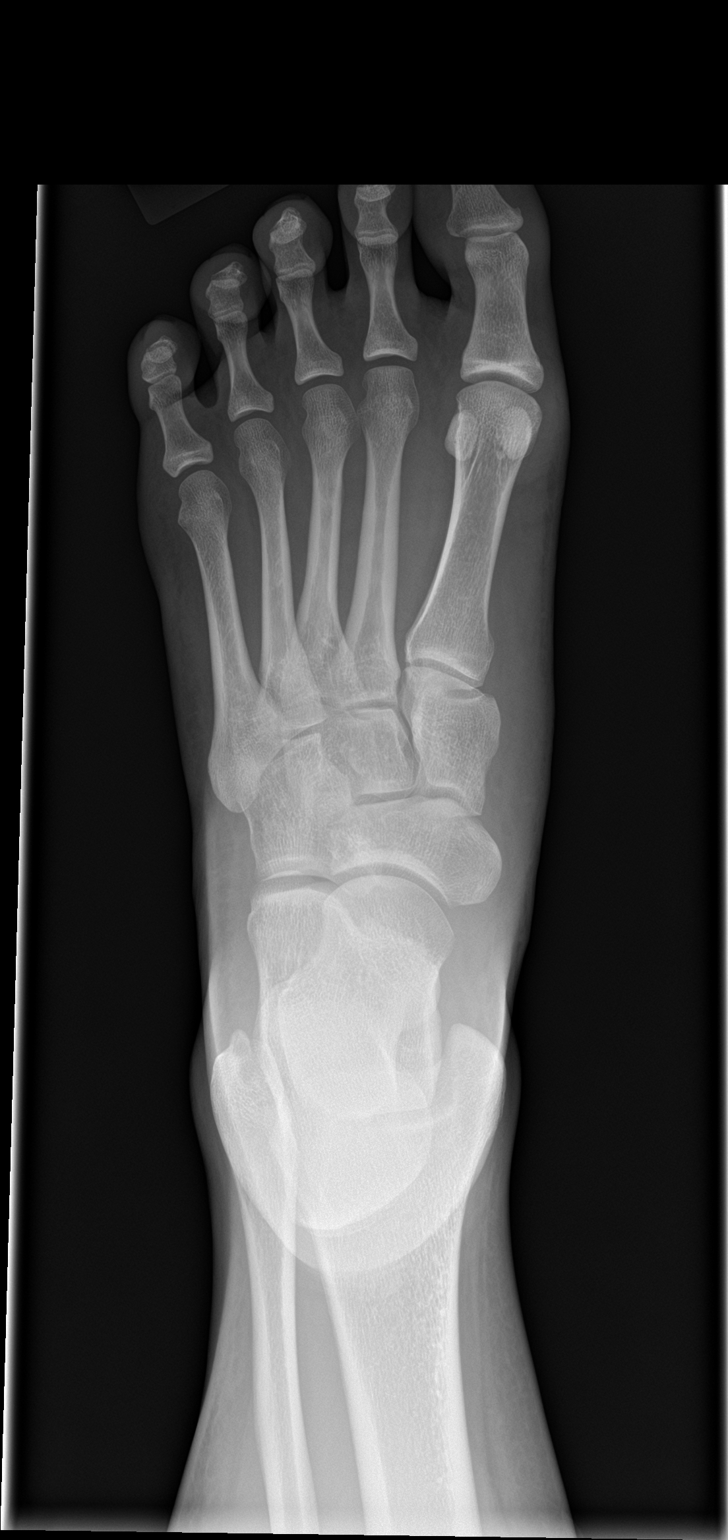

[foot obl]
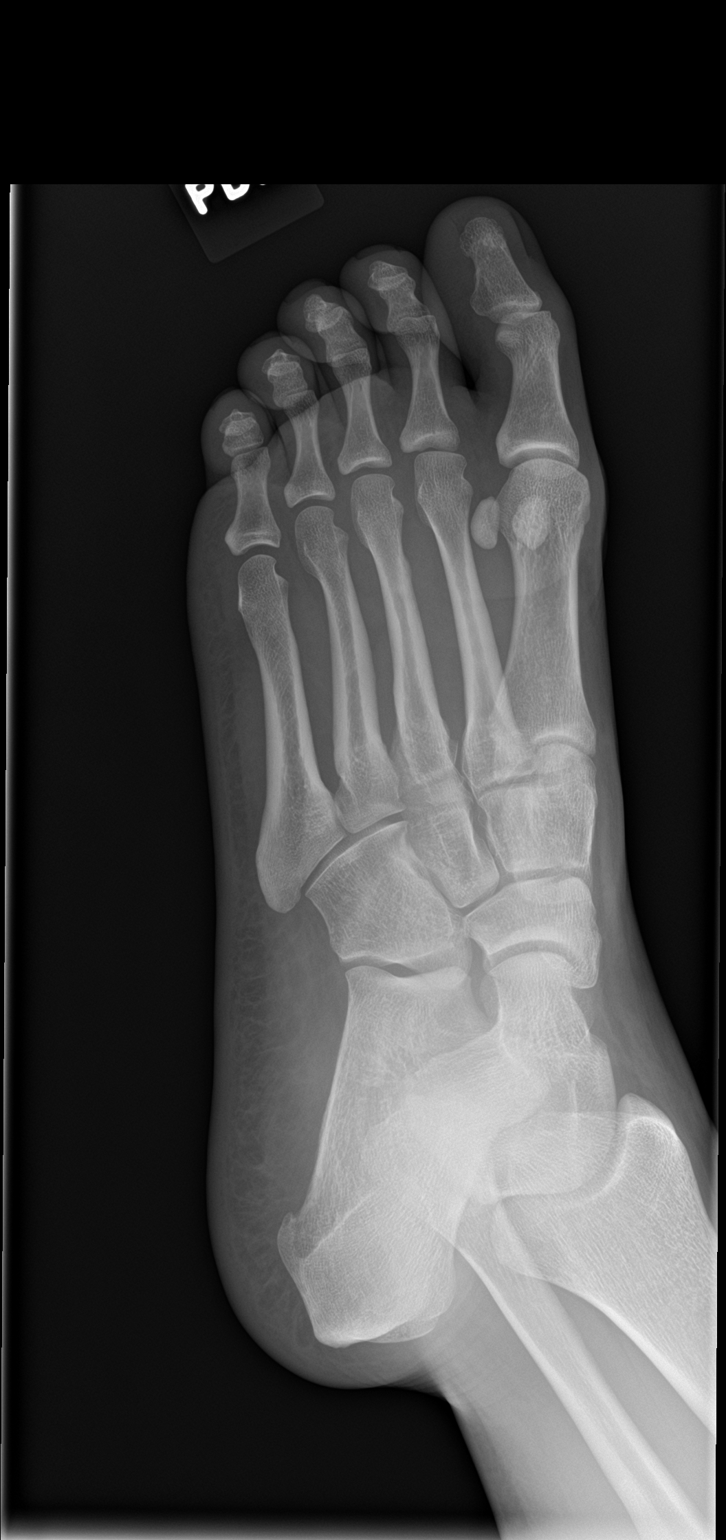

[foot lat]
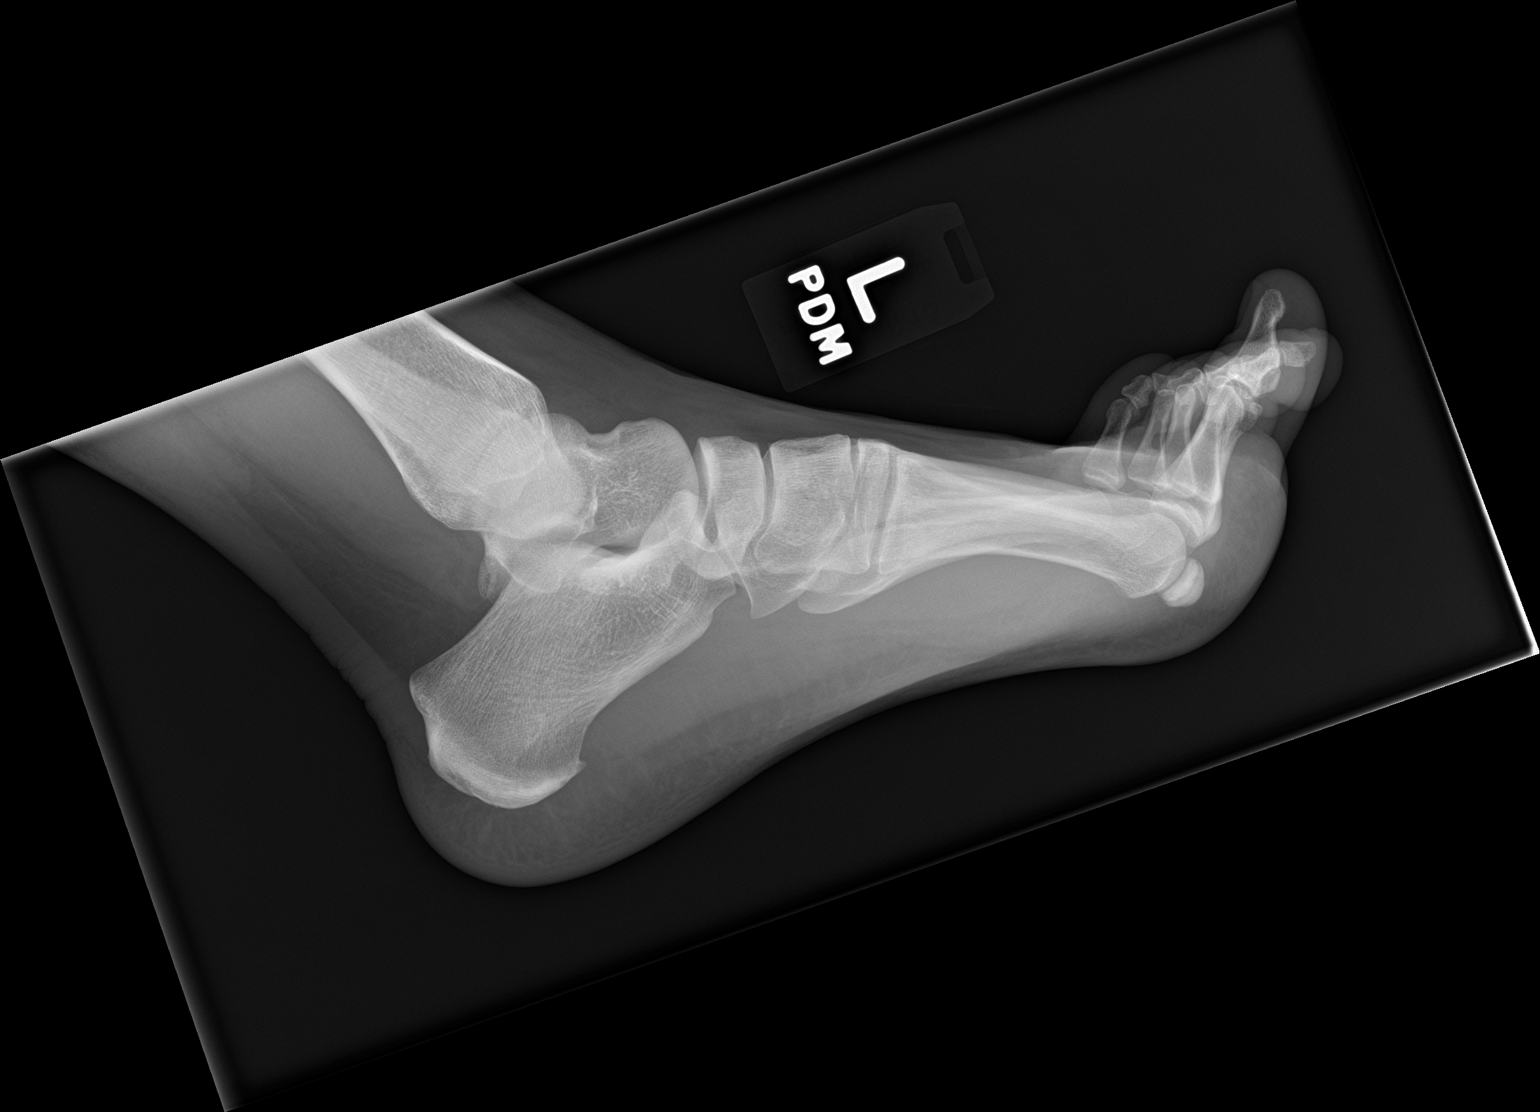

[3 of 3 positions shown; findings below may reference images not displayed]

FINDINGS: There is no evidence of fracture or dislocation. There is no
evidence of arthropathy or other focal bone abnormality. Soft
tissues are unremarkable.
IMPRESSION: No acute abnormality noted.

## 2019-06-23 ENCOUNTER — Other Ambulatory Visit: Payer: Self-pay

## 2019-06-23 DIAGNOSIS — Z20822 Contact with and (suspected) exposure to covid-19: Secondary | ICD-10-CM

## 2019-06-29 LAB — NOVEL CORONAVIRUS, NAA: SARS-CoV-2, NAA: NOT DETECTED

## 2019-07-10 ENCOUNTER — Telehealth: Payer: Self-pay

## 2019-07-10 NOTE — Telephone Encounter (Signed)
Called and informed patient that test for Covid 19 was NEGATIVE. Discussed signs and symptoms of Covid 19 : fever, chills, respiratory symptoms, cough, ENT symptoms, sore throat, SOB, muscle pain, diarrhea, headache, loss of taste/smell, close exposure to COVID-19 patient. Pt instructed to call PCP if they develop the above signs and sx. Pt also instructed to call 911 if having respiratory issues/distress. Discussed MyChart enrollment. Pt verbalized understanding.  

## 2023-01-10 ENCOUNTER — Telehealth: Payer: Self-pay | Admitting: Family Medicine

## 2023-01-10 NOTE — Telephone Encounter (Signed)
Sent patient MyChart message.

## 2023-01-10 NOTE — Telephone Encounter (Signed)
Patient called stating that she was previously seen for her left foot but is now having issues with her right heel. She said that the last time she was seen, she was given orthotics Lexicographer) and was wanting to have something similar. I told her that he did not do custom orthotics here but that she could still be seen here and go from here.  [place] said [place], so wanted to get the most out of her trip home.   Would it be best for her to see Dr Tamala Julian and then go from there or what would be best?  Please advise.

## 2023-02-08 NOTE — Progress Notes (Deleted)
[facility] [address] [place] Phone: [phone] Subjective:    I'm seeing this patient by the request  of:  No primary care provider on file.  CC:   [id number]  Laurie Bright is a 32 y.o. female coming in with complaint of R heel pain.   Xray R heel 01/19/2023  Comment: 3 views of the right foot and 3 views of the right ankle were obtained with no prior summation available for comparison. There is no acute fracture, dislocation, or focal destructive process. The ankle mortise appears intact. There is what appears to be a vague rounded lucency within the posterior calcaneus, potentially related to a degenerative cyst. Moderate-sized plantar calcaneal spur. The tarsal rows appear maintained. The overlying soft tissues appear intact.      Past Medical History:  Diagnosis Date   Allergy    Asthma, intermittent    Past Surgical History:  Procedure Laterality Date   NO PAST SURGERIES     Social History   Socioeconomic History   Marital status: Single    Spouse name: Not on file   Number of children: 0   Years of education: PhD   Highest education level: Not on file  Occupational History   Not on file  Tobacco Use   Smoking status: Never   Smokeless tobacco: Never  Vaping Use   Vaping Use: Never used  Substance and Sexual Activity   Alcohol use: No   Drug use: No   Sexual activity: Yes    Partners: Male    Birth control/protection: I.U.D.  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   Drinks caffeinated beverages, uses herbal remedies. Takes a daily vitamin.   Wears her seatbelt. Wears a bicycle helmet.   Exercise is very routinely both with her current employment and she is also a runner.   Smoke detector in the home. Firearms locked in the home.   Feels safe in her relationships.   Social Determinants of Health   Financial Resource Strain: Not on file   Food Insecurity: Not on file  Transportation Needs: Not on file  Physical Activity: Not on file  Stress: Not on file  Social Connections: Not on file   Not on File Family History  Problem Relation Age of Onset   Diabetes Maternal Grandmother    Diabetes Paternal Grandmother     Current Outpatient Medications (Endocrine & Metabolic):    Levonorgestrel (KYLEENA) 19.5 MG IUD, by Intrauterine route.    Current Outpatient Medications (Analgesics):    meloxicam (MOBIC) 15 MG tablet, Take 1 tablet (15 mg total) by mouth daily.   Current Outpatient Medications (Other):    Diclofenac Sodium (PENNSAID) 2 % SOLN, Place 2 application onto the skin 2 (two) times daily.   Vitamin D, Ergocalciferol, (DRISDOL) 50000 units CAPS capsule, TAKE 1 CAPSULE BY MOUTH EVERY 7 DAYS.   Reviewed prior external information including notes and imaging from  primary care provider As well as notes that were available from care everywhere and other healthcare systems.  Past medical history, social, surgical and family history all reviewed in electronic medical record.  No pertanent information unless stated regarding to the chief complaint.   Review of Systems:  No headache, visual changes, nausea, vomiting, diarrhea, constipation, dizziness, abdominal pain, skin rash, fevers, chills, night sweats, weight loss, swollen lymph nodes, body aches, joint swelling, chest pain, shortness of breath,  mood changes. POSITIVE muscle aches  Objective  There were no vitals taken for this visit.   General: No apparent distress alert and oriented x3 mood and affect normal, dressed appropriately.  HEENT: Pupils equal, extraocular movements intact  Respiratory: Patient's speak in full sentences and does not appear short of breath  Cardiovascular: No lower extremity edema, non tender, no erythema      Impression and Recommendations:

## 2023-02-09 ENCOUNTER — Ambulatory Visit: Payer: Self-pay | Admitting: Family Medicine

## 2023-04-04 ENCOUNTER — Ambulatory Visit: Payer: Self-pay | Admitting: Family Medicine

## 2023-04-07 NOTE — Progress Notes (Signed)
[facility] [address] [place] [address] Phone: [phone] Subjective:   Laurie Bright, am serving as a scribe for Dr. Antoine Bright.  I'm seeing this patient by the request  of:  No primary care provider on file.  CC: Right foot pain  [id number]  Laurie Bright is a 32 y.o. female coming in with complaint of R foot pain. Last seen in 2018. Patient states for past year she has had pain in medial heel. Patient has orthotic from last visit in 2018. Doing calf stretches, theragun. Has not been able to run since February. She will take a break but when she returns her pain will come back. Patient does have some pain with walking.       Past Medical History:  Diagnosis Date   Allergy    Asthma, intermittent    Past Surgical History:  Procedure Laterality Date   NO PAST SURGERIES     Social History   Socioeconomic History   Marital status: Single    Spouse name: Not on file   Number of children: 0   Years of education: PhD   Highest education level: Not on file  Occupational History   Not on file  Tobacco Use   Smoking status: Never   Smokeless tobacco: Never  Vaping Use   Vaping Use: Never used  Substance and Sexual Activity   Alcohol use: No   Drug use: No   Sexual activity: Yes    Partners: Male    Birth control/protection: I.U.D.  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   Drinks caffeinated beverages, uses herbal remedies. Takes a daily vitamin.   Wears her seatbelt. Wears a bicycle helmet.   Exercise is very routinely both with her current employment and she is also a runner.   Smoke detector in the home. Firearms locked in the home.   Feels safe in her relationships.   Social Determinants of Health   Financial Resource Strain: Not on file  Food Insecurity: Not on file  Transportation Needs: Not on file  Physical Activity: Not on file  Stress: Not on  file  Social Connections: Not on file   Not on File Family History  Problem Relation Age of Onset   Diabetes Maternal Grandmother    Diabetes Paternal Grandmother     Current Outpatient Medications (Endocrine & Metabolic):    Levonorgestrel (KYLEENA) 19.5 MG IUD, by Intrauterine route.    Current Outpatient Medications (Analgesics):    meloxicam (MOBIC) 15 MG tablet, Take 1 tablet (15 mg total) by mouth daily.   Current Outpatient Medications (Other):    Diclofenac Sodium (PENNSAID) 2 % SOLN, Place 2 application onto the skin 2 (two) times daily.   gabapentin (NEURONTIN) 100 MG capsule, Take 2 capsules (200 mg total) by mouth at bedtime.   Vitamin D, Ergocalciferol, (DRISDOL) 50000 units CAPS capsule, TAKE 1 CAPSULE BY MOUTH EVERY 7 DAYS.   Reviewed prior external information including notes and imaging from  primary care provider As well as notes that were available from care everywhere and other healthcare systems.  Past medical history, social, surgical and family history all reviewed in electronic medical record.  No pertanent information unless stated regarding to the chief complaint.   Review of Systems:  No headache, visual changes, nausea, vomiting, diarrhea, constipation, dizziness, abdominal pain, skin rash, fevers, chills, night sweats, weight loss, swollen lymph nodes,  body aches, joint swelling, chest pain, shortness of breath, mood changes. POSITIVE muscle aches  Objective  Blood pressure 118/74, pulse 78, height 5' 4.5" (1.638 m), weight 181 lb (82.1 kg), SpO2 99 %.   General: No apparent distress alert and oriented x3 mood and affect normal, dressed appropriately.  HEENT: Pupils equal, extraocular movements intact  Respiratory: Patient's speak in full sentences and does not appear short of breath  Cardiovascular: No lower extremity edema, non tender, no erythema   Patient's right ankle has good range of motion noted.  Does have some tenderness to palpation of  the posterior tibialis tendon.  Patient's navicular bone is unremarkable.  Patient does have some mild pain with compression but no Tinel's over the tarsal tunnel.  Tightness also noted in the parascapular region right greater than left.  Does have some crepitus noted.  Limited muscular skeletal ultrasound was performed and interpreted by Laurie Bright, M  Did ultrasound of patient's right posterior tibialis tendon does have hypoechoic changes consistent with effusion noted.  No significant tearing noted.  Does seem to be very close to the tibial nerve in the area.  Possible mild dilatation of the nerve noted.  Mild retrocalcaneal bursitis noted as well. Impression: Posterior tibialis tendinitis  [id number]; 15 additional minutes spent for Therapeutic exercises as stated in above notes.  This included exercises focusing on stretching, strengthening, with significant focus on eccentric aspects.   Long term goals include an improvement in range of motion, strength, endurance as well as avoiding reinjury. Patient's frequency would include in 1-2 times a day, 3-5 times a week for a duration of 6-12 weeks.  Ankle strengthening that included:  Basic range of motion exercises to allow proper full motion at ankle Stretching of the lower leg and hamstrings  Theraband exercises for the lower leg - inversion, eversion, dorsiflexion and plantarflexion each to be completed with a theraband Balance exercises to increase proprioception Weight bearing exercises to increase strength and balance Proper technique shown and discussed handout in great detail with ATC.  All questions were discussed and answered.     Impression and Recommendations:    The above documentation has been reviewed and is accurate and complete Laurie Saa, DO

## 2023-04-12 ENCOUNTER — Encounter: Payer: Self-pay | Admitting: Family Medicine

## 2023-04-12 ENCOUNTER — Ambulatory Visit: Payer: Self-pay

## 2023-04-12 ENCOUNTER — Ambulatory Visit (INDEPENDENT_AMBULATORY_CARE_PROVIDER_SITE_OTHER): Payer: Managed Care, Other (non HMO) | Admitting: Family Medicine

## 2023-04-12 VITALS — BP 118/74 | HR 78 | Ht 64.5 in | Wt 181.0 lb

## 2023-04-12 DIAGNOSIS — M79671 Pain in right foot: Secondary | ICD-10-CM

## 2023-04-12 DIAGNOSIS — G2589 Other specified extrapyramidal and movement disorders: Secondary | ICD-10-CM | POA: Insufficient documentation

## 2023-04-12 DIAGNOSIS — M76821 Posterior tibial tendinitis, right leg: Secondary | ICD-10-CM

## 2023-04-12 MED ORDER — GABAPENTIN 100 MG PO CAPS: 200.0000 mg | ORAL_CAPSULE | Freq: Every day | ORAL | 0 refills | Status: AC

## 2023-04-12 NOTE — Assessment & Plan Note (Signed)
Tendinitis of the right side.  Having difficulty with the contralateral side.  Discussed over-the-counter orthotics, we discussed heel lift that could be beneficial.  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  Worsening pain consider injection and formal physical therapy.  [place] which does make this difficult.  Follow-up with me again in 6 to 8 weeks if home again.

## 2023-04-12 NOTE — Patient Instructions (Addendum)
[facility] Total Support Orthotics Exercises for ankle Scapular exercises Air heel Recovery sandals in the house OOFOS and HOKA Gabapentin 200mg  at night See me again in 6-8 weeks

## 2023-04-12 NOTE — Assessment & Plan Note (Addendum)
Scapular dyskinesis noted.  Discussed icing regimen and home exercises, which activities to do and which ones to avoid, increase activity slowly.  Follow-up again in 6 to 8 weeks.  Differential includes lumbar radiculopathy and could be contributing.  Follow-up again after trying gabapentin as well.

## 2023-05-10 ENCOUNTER — Encounter: Payer: Self-pay | Admitting: Family Medicine

## 2023-05-10 ENCOUNTER — Other Ambulatory Visit: Payer: Self-pay

## 2023-05-10 DIAGNOSIS — M546 Pain in thoracic spine: Secondary | ICD-10-CM

## 2023-06-09 ENCOUNTER — Ambulatory Visit: Payer: Managed Care, Other (non HMO) | Admitting: Family Medicine

## 2023-06-13 NOTE — Progress Notes (Signed)
[facility] [address] [place] [address] Phone: [phone] Subjective:   Laurie Bright, am serving as a scribe for Dr. Antoine Bright.  I'm seeing this patient by the request  of:  Pcp, No  CC: Heel pain  [id number]  04/12/2023 Scapular dyskinesis noted.  Discussed icing regimen and home exercises, which activities to do and which ones to avoid, increase activity slowly.  Follow-up again in 6 to 8 weeks.  Differential includes lumbar radiculopathy and could be contributing.  Follow-up again after trying gabapentin as well.     Tendinitis of the right side. Having difficulty with the contralateral side. Discussed over-the-counter orthotics, we discussed heel lift that could be beneficial. 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 Worsening pain consider injection and formal physical therapy. Patient is now [place] which does make this difficult. Follow-up with me again in 6 to 8 weeks if home again.   Updated 06/15/2023 Laurie Bright is a 31 y.o. female coming in with complaint of thoracic spine and R heel pain, was found to  posterior tibialis tendinitis.  Patient states that her heel pain has improved with use of the brace. Wants to try running but has not as she is scared due to slight discomfort at rest.   Patient was on pilates reformer and twisted. Had a pop in center of her back. Muscles have been spasming in R scapula and she feels a burning sensation.        Past Medical History:  Diagnosis Date   Allergy    Asthma, intermittent    Past Surgical History:  Procedure Laterality Date   NO PAST SURGERIES     Social History   Socioeconomic History   Marital status: Single    Spouse name: Not on file   Number of children: 0   Years of education: PhD   Highest education level: Not on file  Occupational History   Not on file  Tobacco Use   Smoking status: Never   Smokeless tobacco: Never  Vaping Use   Vaping  Use: Never used  Substance and Sexual Activity   Alcohol use: No   Drug use: No   Sexual activity: Yes    Partners: Male    Birth control/protection: I.U.D.  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   Drinks caffeinated beverages, uses herbal remedies. Takes a daily vitamin.   Wears her seatbelt. Wears a bicycle helmet.   Exercise is very routinely both with her current employment and she is also a runner.   Smoke detector in the home. Firearms locked in the home.   Feels safe in her relationships.   Social Determinants of Health   Financial Resource Strain: Not on file  Food Insecurity: Not on file  Transportation Needs: Not on file  Physical Activity: Not on file  Stress: Not on file  Social Connections: Not on file   Not on File Family History  Problem Relation Age of Onset   Diabetes Maternal Grandmother    Diabetes Paternal Grandmother     Current Outpatient Medications (Endocrine & Metabolic):    Levonorgestrel (KYLEENA) 19.5 MG IUD, by Intrauterine route.    Current Outpatient Medications (Analgesics):    meloxicam (MOBIC) 15 MG tablet, Take 1 tablet (15 mg total) by mouth daily.   Current Outpatient Medications (Other):    Diclofenac Sodium (PENNSAID) 2 % SOLN, Place  2 application onto the skin 2 (two) times daily.   gabapentin (NEURONTIN) 100 MG capsule, Take 2 capsules (200 mg total) by mouth at bedtime.   Vitamin D, Ergocalciferol, (DRISDOL) 50000 units CAPS capsule, TAKE 1 CAPSULE BY MOUTH EVERY 7 DAYS.   Reviewed prior external information including notes and imaging from  primary care provider As well as notes that were available from care everywhere and other healthcare systems.  Past medical history, social, surgical and family history all reviewed in electronic medical record.  No pertanent information unless stated regarding to the chief complaint.   Review of Systems:  No headache,  visual changes, nausea, vomiting, diarrhea, constipation, dizziness, abdominal pain, skin rash, fevers, chills, night sweats, weight loss, swollen lymph nodes, body aches, joint swelling, chest pain, shortness of breath, mood changes. POSITIVE muscle aches  Objective  Blood pressure 110/72, pulse 71, height 5' 4.5" (1.638 m), weight 180 lb (81.6 kg), SpO2 98 %.   General: No apparent distress alert and oriented x3 mood and affect normal, dressed appropriately.  HEENT: Pupils equal, extraocular movements intact  Respiratory: Patient's speak in full sentences and does not appear short of breath  Cardiovascular: No lower extremity edema, non tender, no erythema  Right heel exam shows patient still has some tenderness noted on exam today.  Seems to be on the medial aspect of the plantar aspect of the foot.  Mild positive Tinel's over the tarsal tunnel. Back exam does have some loss of lordosis noted.  Tightness of the right hip flexor on the right side but does have scapular dyskinesis also noted right greater than left.  Tightness in the parascapular area.  Osteopathic findings C2 flexed rotated and side bent right C7 flexed rotated and side bent left T3 extended rotated and side bent right inhaled third rib T8 extended rotated and side bent left inhaled rib  L3 F RS left   Limited muscular skeletal ultrasound was performed and interpreted by Laurie Bright, M  Limited ultrasound shows the patient does have dilatation of the plantar and posterior tibialis nerves.  Posterior tibialis tendon appears to be unremarkable.  Does have some mild scarring and appears of the plantar fascia.  Otherwise fairly unremarkable. Impression: Mild tibial nerve dilatation    Impression and Recommendations:     The above documentation has been reviewed and is accurate and complete Laurie Saa, DO

## 2023-06-15 ENCOUNTER — Ambulatory Visit: Payer: Managed Care, Other (non HMO) | Admitting: Family Medicine

## 2023-06-15 ENCOUNTER — Other Ambulatory Visit: Payer: Self-pay

## 2023-06-15 ENCOUNTER — Encounter: Payer: Self-pay | Admitting: Family Medicine

## 2023-06-15 VITALS — BP 110/72 | HR 71 | Ht 64.5 in | Wt 180.0 lb

## 2023-06-15 DIAGNOSIS — G2589 Other specified extrapyramidal and movement disorders: Secondary | ICD-10-CM | POA: Diagnosis not present

## 2023-06-15 DIAGNOSIS — M9901 Segmental and somatic dysfunction of cervical region: Secondary | ICD-10-CM

## 2023-06-15 DIAGNOSIS — M9904 Segmental and somatic dysfunction of sacral region: Secondary | ICD-10-CM

## 2023-06-15 DIAGNOSIS — M2550 Pain in unspecified joint: Secondary | ICD-10-CM

## 2023-06-15 DIAGNOSIS — M9902 Segmental and somatic dysfunction of thoracic region: Secondary | ICD-10-CM

## 2023-06-15 DIAGNOSIS — M9903 Segmental and somatic dysfunction of lumbar region: Secondary | ICD-10-CM | POA: Diagnosis not present

## 2023-06-15 DIAGNOSIS — M79671 Pain in right foot: Secondary | ICD-10-CM

## 2023-06-15 DIAGNOSIS — G8929 Other chronic pain: Secondary | ICD-10-CM

## 2023-06-15 DIAGNOSIS — M76891 Other specified enthesopathies of right lower limb, excluding foot: Secondary | ICD-10-CM

## 2023-06-15 DIAGNOSIS — M9908 Segmental and somatic dysfunction of rib cage: Secondary | ICD-10-CM

## 2023-06-15 DIAGNOSIS — M76821 Posterior tibial tendinitis, right leg: Secondary | ICD-10-CM | POA: Diagnosis not present

## 2023-06-15 LAB — CBC WITH DIFFERENTIAL/PLATELET
Basophils Absolute: 0.1 10*3/uL (ref 0.0–0.1)
Basophils Relative: 1 % (ref 0.0–3.0)
Eosinophils Absolute: 0.2 10*3/uL (ref 0.0–0.7)
Eosinophils Relative: 2.9 % (ref 0.0–5.0)
HCT: 39.4 % (ref 36.0–46.0)
Hemoglobin: 12.9 g/dL (ref 12.0–15.0)
Lymphocytes Relative: 34.1 % (ref 12.0–46.0)
Lymphs Abs: 2.7 10*3/uL (ref 0.7–4.0)
MCHC: 32.7 g/dL (ref 30.0–36.0)
MCV: 94.8 fl (ref 78.0–100.0)
Monocytes Absolute: 0.8 10*3/uL (ref 0.1–1.0)
Monocytes Relative: 10 % (ref 3.0–12.0)
Neutro Abs: 4.1 10*3/uL (ref 1.4–7.7)
Neutrophils Relative %: 52 % (ref 43.0–77.0)
Platelets: 330 10*3/uL (ref 150.0–400.0)
RBC: 4.16 Mil/uL (ref 3.87–5.11)
RDW: 13.2 % (ref 11.5–15.5)
WBC: 7.9 10*3/uL (ref 4.0–10.5)

## 2023-06-15 LAB — COMPREHENSIVE METABOLIC PANEL
ALT: 14 U/L (ref 0–35)
AST: 18 U/L (ref 0–37)
Albumin: 4.4 g/dL (ref 3.5–5.2)
Alkaline Phosphatase: 46 U/L (ref 39–117)
BUN: 18 mg/dL (ref 6–23)
CO2: 29 mEq/L (ref 19–32)
Calcium: 9.6 mg/dL (ref 8.4–10.5)
Chloride: 101 mEq/L (ref 96–112)
Creatinine, Ser: 1.2 mg/dL (ref 0.40–1.20)
GFR: 60.46 mL/min (ref >60.00)
Glucose, Bld: 94 mg/dL (ref 70–99)
Potassium: 3.8 mEq/L (ref 3.5–5.1)
Sodium: 137 mEq/L (ref 135–145)
Total Bilirubin: 0.4 mg/dL (ref 0.2–1.2)
Total Protein: 7.9 g/dL (ref 6.0–8.3)

## 2023-06-15 LAB — C-REACTIVE PROTEIN: CRP: <1 mg/dL (ref 0.5–20.0)

## 2023-06-15 LAB — TSH: TSH: 1.45 u[IU]/mL (ref 0.35–5.50)

## 2023-06-15 LAB — URIC ACID: Uric Acid, Serum: 4.5 mg/dL (ref 2.4–7.0)

## 2023-06-15 LAB — IBC PANEL
Iron: 157 ug/dL — ABNORMAL HIGH (ref 42–145)
Saturation Ratios: 51.4 % — ABNORMAL HIGH (ref 20.0–50.0)
TIBC: 305.2 ug/dL (ref 250.0–450.0)
Transferrin: 218 mg/dL (ref 212.0–360.0)

## 2023-06-15 LAB — VITAMIN B12: Vitamin B-12: 553 pg/mL (ref 211–911)

## 2023-06-15 LAB — FERRITIN: Ferritin: 52.9 ng/mL (ref 10.0–291.0)

## 2023-06-15 LAB — VITAMIN D 25 HYDROXY (VIT D DEFICIENCY, FRACTURES): VITD: 34.29 ng/mL (ref 30.00–100.00)

## 2023-06-15 LAB — SEDIMENTATION RATE: Sed Rate: 32 mm/hr — ABNORMAL HIGH (ref 0–20)

## 2023-06-15 NOTE — Patient Instructions (Addendum)
Ankle exercises OOFOS Or HOKA in the house 1 min jog one minute walk for 30 minutes All labs See me in 3 months

## 2023-06-15 NOTE — Assessment & Plan Note (Signed)
Chronic problem as well.  Attempted osteopathic manipulation.  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  We discussed with patient about continuing to work on scapular strengthening exercises.  Worsening pain would be considering possible trigger point injections.  Patient's MRI was unremarkable.  We discussed having also large breasts which could be playing a role.  Will continue work on posture.  Follow-up again in 6 to 8 weeks if in town.

## 2023-06-15 NOTE — Assessment & Plan Note (Signed)
Due to the tightness we will continue to monitor.  We discussed with patient having continuing some discomfort in different areas of the ankle with consider the possibility of laboratory workup to make sure were not missing anything else at this time.  Follow-up again when in town again in the next several months

## 2023-06-15 NOTE — Assessment & Plan Note (Signed)
It appears that the posterior tibialis seems to be better but I am more concerned the patient is having more of a plantar nerve impingement at this time.  We discussed which activities to do and which ones to avoid.  We discussed the possibility of potential injections as well.  At this point I want to see if patient can start increasing activity and how patient tolerates it.

## 2023-06-18 LAB — PTH, INTACT AND CALCIUM
Calcium: 9.7 mg/dL (ref 8.6–10.2)
PTH: 45 pg/mL (ref 16–77)

## 2023-06-18 LAB — RHEUMATOID FACTOR: Rheumatoid fact SerPl-aCnc: <10 IU/mL (ref <14)

## 2023-06-18 LAB — ANA: Anti Nuclear Antibody (ANA): NEGATIVE

## 2023-06-18 LAB — ANGIOTENSIN CONVERTING ENZYME: Angiotensin-Converting Enzyme: 40 U/L (ref 9–67)

## 2023-06-18 LAB — CYCLIC CITRUL PEPTIDE ANTIBODY, IGG: Cyclic Citrullin Peptide Ab: <16 UNITS

## 2023-06-18 LAB — CALCIUM, IONIZED: Calcium, Ion: 5 mg/dL (ref 4.7–5.5)
